# Patient Record
Sex: Male | Born: 1975 | Race: White | Marital: Married | Smoking: Former smoker
Health system: Southern US, Community
[De-identification: ages and names within clinical notes are randomized; demographics above are authoritative.]

---

## 2010-07-17 ENCOUNTER — Ambulatory Visit
Admission: RE | Admit: 2010-07-17 | Discharge: 2010-07-17 | Disposition: A | Discharge status: Home / Self Care | Payer: PRIVATE HEALTH INSURANCE | Source: Ambulatory Visit | Attending: Occupational Medicine | Admitting: Occupational Medicine

## 2010-07-17 ENCOUNTER — Other Ambulatory Visit: Payer: Self-pay | Admitting: Occupational Medicine

## 2010-07-17 DIAGNOSIS — Z021 Encounter for pre-employment examination: Secondary | ICD-10-CM

## 2011-04-21 ENCOUNTER — Other Ambulatory Visit: Payer: Self-pay | Admitting: Occupational Medicine

## 2011-04-21 ENCOUNTER — Ambulatory Visit
Admission: RE | Admit: 2011-04-21 | Discharge: 2011-04-21 | Disposition: A | Discharge status: Home / Self Care | Payer: PRIVATE HEALTH INSURANCE | Source: Ambulatory Visit | Attending: Occupational Medicine | Admitting: Occupational Medicine

## 2011-04-21 DIAGNOSIS — T1490XA Injury, unspecified, initial encounter: Secondary | ICD-10-CM

## 2012-04-24 IMAGING — CR DG WRIST COMPLETE 3+V*R*
4 series · 4 of 4 positions shown · non-contrast
Comparison: None

CLINICAL DATA: Fall 2 days ago.  Pain with motion.

RIGHT WRIST - COMPLETE 3+ VIEW

[view not recorded (1 of 4)]
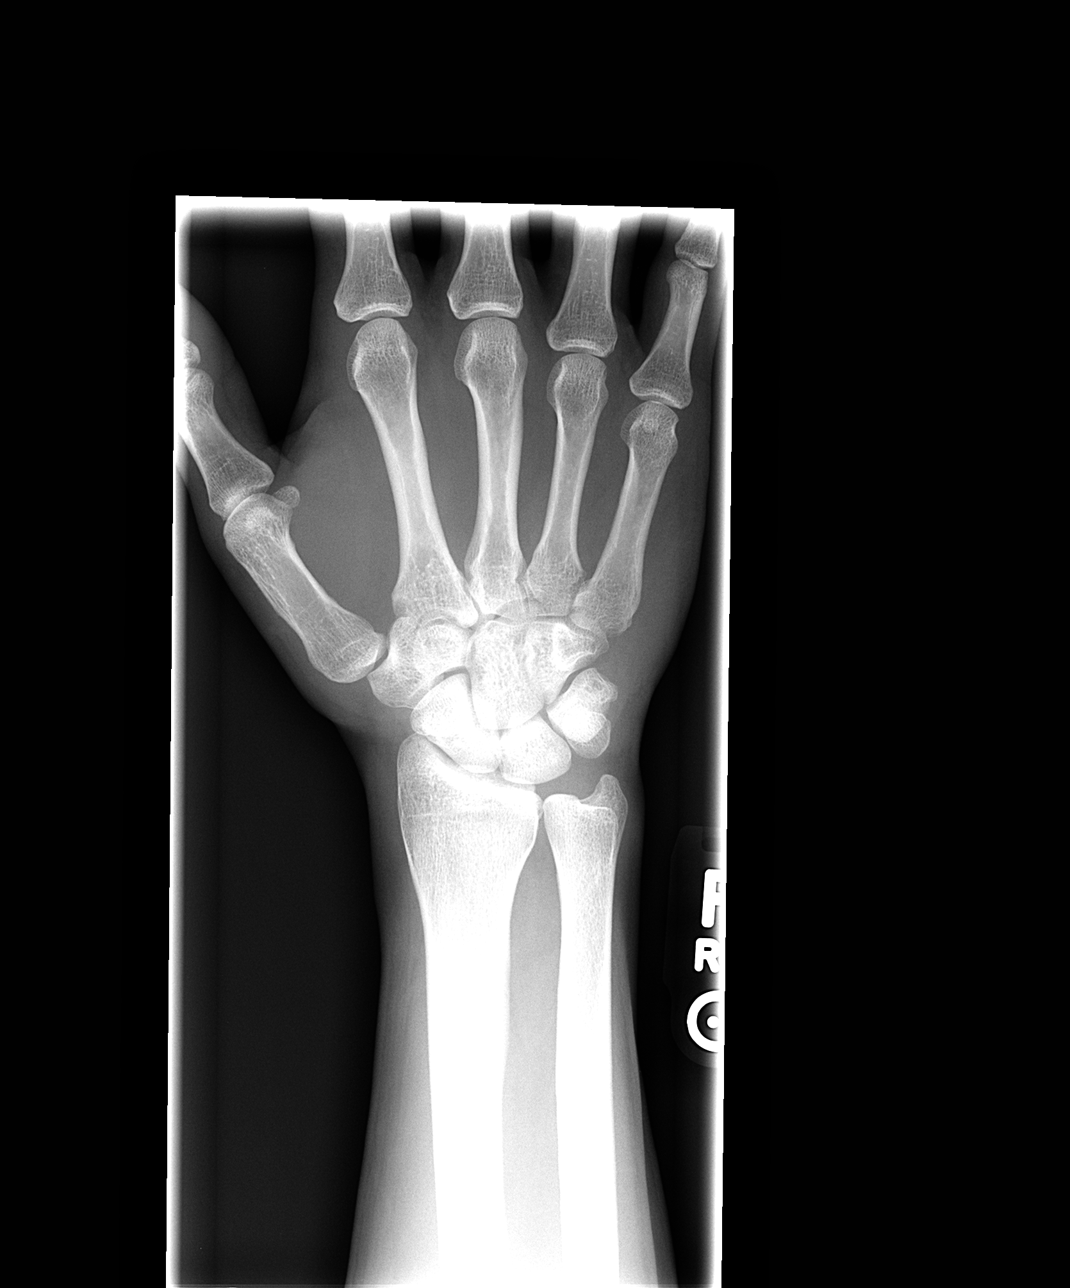

[view not recorded (2 of 4)]
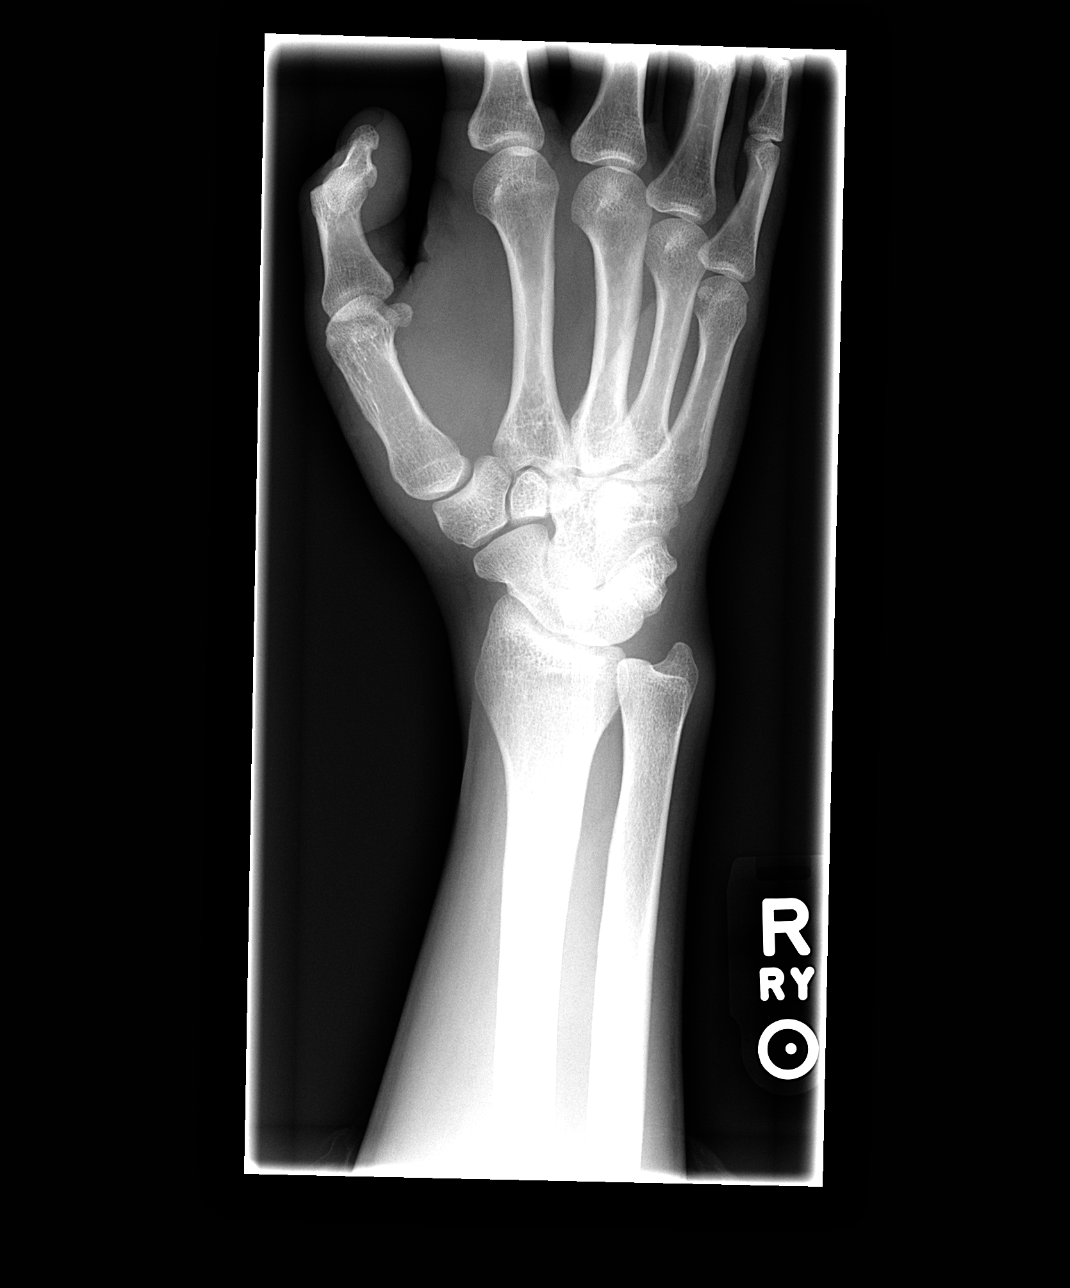

[view not recorded (3 of 4)]
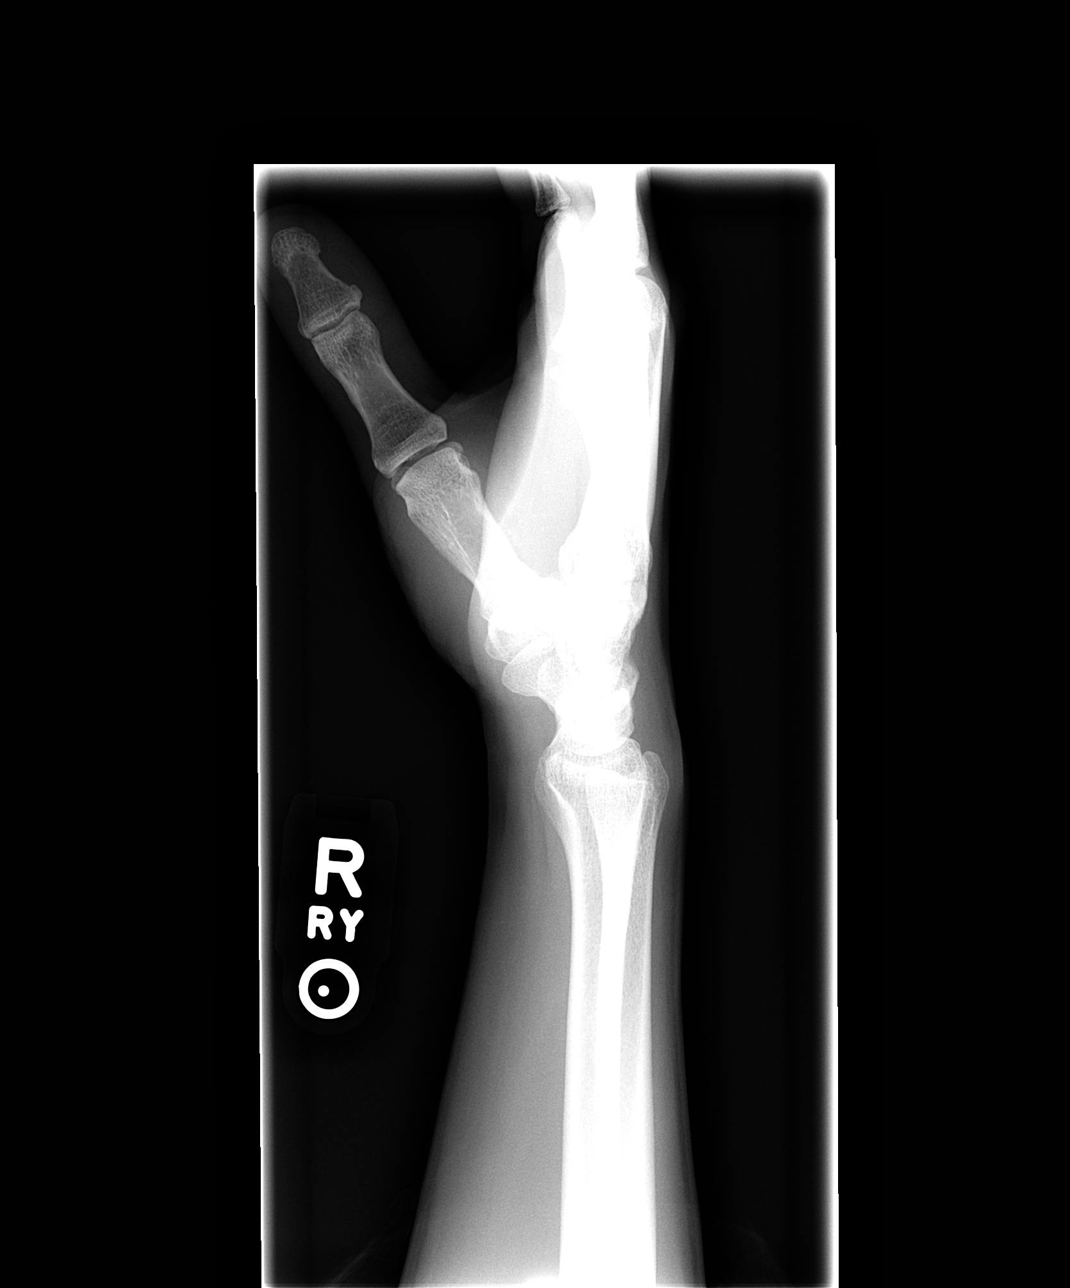

[view not recorded (4 of 4)]
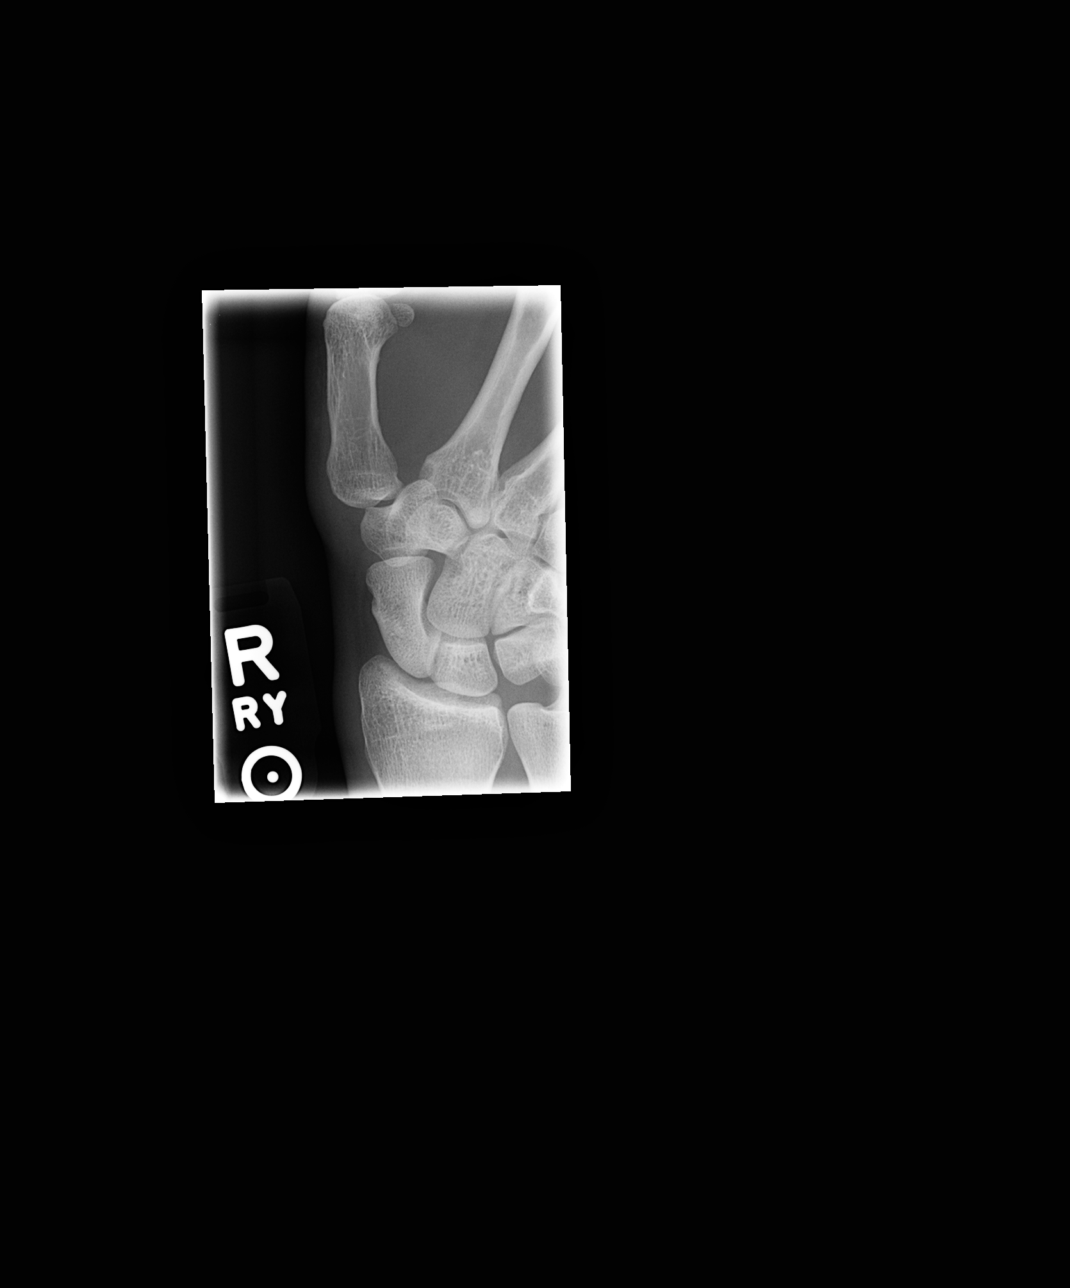

[4 of 4 positions shown; findings below may reference images not displayed]

FINDINGS: No acute fracture or dislocation.  Joint spaces are
maintained.  Scaphoid intact.
IMPRESSION: No acute osseous abnormality.

## 2016-06-23 DIAGNOSIS — S39012A Strain of muscle, fascia and tendon of lower back, initial encounter: Secondary | ICD-10-CM | POA: Diagnosis not present

## 2016-06-30 DIAGNOSIS — S39012D Strain of muscle, fascia and tendon of lower back, subsequent encounter: Secondary | ICD-10-CM | POA: Diagnosis not present

## 2016-08-10 DIAGNOSIS — Z23 Encounter for immunization: Secondary | ICD-10-CM | POA: Diagnosis not present

## 2016-08-10 DIAGNOSIS — Z0000 Encounter for general adult medical examination without abnormal findings: Secondary | ICD-10-CM | POA: Diagnosis not present

## 2016-08-10 DIAGNOSIS — M5416 Radiculopathy, lumbar region: Secondary | ICD-10-CM | POA: Diagnosis not present

## 2016-09-21 ENCOUNTER — Other Ambulatory Visit: Payer: Self-pay | Admitting: Family Medicine

## 2016-09-21 ENCOUNTER — Ambulatory Visit
Admission: RE | Admit: 2016-09-21 | Discharge: 2016-09-21 | Disposition: A | Discharge status: Home / Self Care | Payer: 59 | Source: Ambulatory Visit | Attending: Family Medicine | Admitting: Family Medicine

## 2016-09-21 DIAGNOSIS — M5416 Radiculopathy, lumbar region: Secondary | ICD-10-CM | POA: Diagnosis not present

## 2016-09-21 DIAGNOSIS — M47816 Spondylosis without myelopathy or radiculopathy, lumbar region: Secondary | ICD-10-CM | POA: Diagnosis not present

## 2016-09-21 DIAGNOSIS — E782 Mixed hyperlipidemia: Secondary | ICD-10-CM | POA: Diagnosis not present

## 2016-09-22 ENCOUNTER — Encounter: Payer: Self-pay | Admitting: Physical Therapy

## 2016-09-22 ENCOUNTER — Ambulatory Visit: Payer: 59 | Attending: Family Medicine | Admitting: Physical Therapy

## 2016-09-22 DIAGNOSIS — M545 Low back pain: Secondary | ICD-10-CM | POA: Diagnosis not present

## 2016-09-22 DIAGNOSIS — M62830 Muscle spasm of back: Secondary | ICD-10-CM | POA: Diagnosis not present

## 2016-09-22 DIAGNOSIS — M6281 Muscle weakness (generalized): Secondary | ICD-10-CM | POA: Diagnosis not present

## 2016-09-22 NOTE — Patient Instructions (Signed)
Back Hyperextension: Using Arms    Lying face down with arms bent, inhale. Then while exhaling, start by just going up to forearms, straighten arms as able. Hold _5___ seconds. Slowly return to starting position. Repeat __10__ times per set. Do __1-2__ sets per session. Do __3__ sessions per day.  Copyright  VHI. All rights reserved.

## 2016-09-23 NOTE — Therapy (Signed)
[facility] [address], [place], [place], [address] Phone: [phone]   Fax:  [phone]  Physical Therapy Evaluation  Patient Details  Name: Zachary Oliver MRN: [id number] Date of Birth: [birth year] Referring Provider: Lennette Bihari Via  Encounter Date: 09/22/2016      PT End of Session - 09/22/16 0753    Visit Number 1   Date for PT Re-Evaluation 12/15/16   PT Start Time 0756   PT Stop Time 0850   PT Time Calculation (min) 54 min   Activity Tolerance Patient tolerated treatment well   Behavior During Therapy [facility] for tasks assessed/performed      History reviewed. No pertinent past medical history.  History reviewed. No pertinent surgical history.  There were no vitals filed for this visit.       Subjective Assessment - 09/22/16 0757    Subjective Around Easter I was moving some things with poor mechanic, and my back started with stiffness and then progressed to lightning pain down back of legs and up into my neck.  I couldn't move well after that.  It has gotten better but I still feel like my muscle is going to cramp in the left hamstrings and calf, and the pain is focused more in left lower back.     Limitations Standing   How long can you stand comfortably? 20-30 minutes   Diagnostic tests x-ray   Patient Stated Goals Get back to where I can exercise on my own and be able to stand for 4 hours   Currently in Pain? Yes   Pain Score 2   after standing up to 7/10   Pain Location Back   Pain Orientation Left;Lower   Pain Descriptors / Indicators Aching;Throbbing   Pain Type Acute pain   Pain Onset More than a month ago   Pain Frequency Intermittent   Aggravating Factors  standing all day, can't lift left leg at the end of the day   Pain Relieving Factors heat, ice, and medication   Effect of Pain on Daily Activities working and exercise   Multiple Pain Sites No            [facility] PT Assessment - 09/23/16  0001      Assessment   Medical Diagnosis M54.16 (ICD-10-CM) - Radiculopathy, lumbar region   Onset Date/Surgical Date --  April 2018   Prior Therapy No     Precautions   Precautions None     Restrictions   Weight Bearing Restrictions No     Home Ecologist residence   Living Arrangements Spouse/significant other;Children     Prior Function   Level of Independence Independent   Vocation Full time employment   Vocation Requirements generally sitting, sometimes standing at night     Cognition   Overall Cognitive Status Within Functional Limits for tasks assessed     Observation/Other Assessments   Focus on Therapeutic Outcomes (FOTO)  54% limited     Posture/Postural Control   Posture/Postural Control Postural limitations   Postural Limitations Decreased lumbar lordosis     AROM   Lumbar Flexion 50% limited  pulling in back   Lumbar Extension 50% limited  increased pain     Strength   Right Hip Flexion 4+/5   Right Hip Extension 4+/5   Right Hip External Rotation  5/5   Right Hip Internal Rotation 4+/5   Right Hip ABduction 5/5   Right Hip ADduction 4+/5  Left Hip Flexion 4+/5   Left Hip Extension 4+/5   Left Hip External Rotation 4/5   Left Hip Internal Rotation 4/5   Left Hip ABduction 4/5   Left Hip ADduction 4/5     Flexibility   Soft Tissue Assessment /Muscle Length yes   Hamstrings 45 deg     Palpation   SI assessment  WNL   Palpation comment lumbar paraspinals tight     Special Tests    Special Tests Lumbar   Lumbar Tests Straight Leg Raise     Straight Leg Raise   Findings Positive   Side  Left   Comment improved with pelvic compression     Ambulation/Gait   Gait Pattern Within Functional Limits            Objective measurements completed on examination: See above findings.          [facility] [place] Adult PT Treatment/Exercise - 09/23/16 0001      Exercises   Exercises Lumbar     Lumbar Exercises:  Prone   Other Prone Lumbar Exercises press ups - 2 x 10     Modalities   Modalities Electrical Stimulation;Moist Heat     Moist Heat Therapy   Number Minutes Moist Heat 15 Minutes   Moist Heat Location Lumbar Spine     Electrical Stimulation   Electrical Stimulation Location lumbar    Electrical Stimulation Action IFC   Electrical Stimulation Parameters to tolerance x 15 min   Electrical Stimulation Goals Pain                PT Education - 09/22/16 2025    Education provided Yes   Education Details prone press ups   Person(s) Educated Patient   Methods Explanation;Handout   Comprehension Verbalized understanding          PT Short Term Goals - 09/23/16 0829      PT SHORT TERM GOAL #1   Title independent with initial HEP   Time 4   Period Weeks   Status New     PT SHORT TERM GOAL #2   Title reports 25% reduction of pain when getting out of bed in the morning   Time 4   Period Weeks   Status New     PT SHORT TERM GOAL #3   Title able to stand for 1 hour without increased pain   Time 4   Period Weeks   Status New     PT SHORT TERM GOAL #4   Title reports no radiating pain and able to centralize pain with extension exercises   Time 4   Period Weeks   Status New           PT Long Term Goals - 09/22/16 0865      PT LONG TERM GOAL #1   Title FOTO < or equal to 34% limited   Time 8   Period Weeks   Status New     PT LONG TERM GOAL #2   Title able to perform all job related tasks including standing for long periods of time with 50% less pain   Time 8   Period Weeks   Status New     PT LONG TERM GOAL #3   Title able to return to a regular exercise routine and safely work back into full exercise routine as part of advanced HEP   Time 8   Period Weeks   Status New     PT LONG TERM  GOAL #4   Title able to safely lift his child with good body mechanics and no increased pain   Time 8   Period Weeks   Status New                 Plan - 09/23/16 0813    Clinical Impression Statement Patient is active 41 y/o male who presents to the clinic with recent onset of low back pain with radiculopathy down left leg.  He started having this issue when he was lifting boxes that were far away from his body.  He is currently experiencing pain when standing for long hours which he has to do for work several times per week.  He is also unable to participate in his normal workout routine.  He has LE weakness left>right.  He has a positive straight leg raise test on the left but symptoms improve with pelvic compression demonstrating some core weakness.  His stork test also indicates some pelvic instability.  Pt has centralizing of symptoms with extension exerises.  He also has muscle spasm along lumbar paraspinals and decreased flexibility in the hamstrings.  Pt will benefit from skilled PT to address these impairments and return to PLOF.   Clinical Presentation Stable   Clinical Presentation due to: Not worsening   Clinical Decision Making Low   Rehab Potential Excellent   Clinical Impairments Affecting Rehab Potential n/a   PT Frequency 2x / week   PT Duration 8 weeks   PT Treatment/Interventions ADLs/Self Care Home Management;Cryotherapy;Electrical Stimulation;Iontophoresis 4mg /ml Dexamethasone;Moist Heat;Traction;Ultrasound;Therapeutic activities;Therapeutic exercise;Neuromuscular re-education;Patient/family education;Manual techniques;Dry needling;Taping   PT Next Visit Plan posture, extension exercises, hamstring stretch, core and hip strengthening, estim/heat/ice as needed   Recommended Other Services none   Consulted and Agree with Plan of Care Patient      Patient will benefit from skilled therapeutic intervention in order to improve the following deficits and impairments:  Decreased range of motion, Decreased strength, Increased muscle spasms, Pain, Impaired flexibility, Postural dysfunction  Visit Diagnosis: Acute left-sided  low back pain, with sciatica presence unspecified - Plan: PT plan of care cert/re-cert  Muscle weakness (generalized) - Plan: PT plan of care cert/re-cert  Muscle spasm of back - Plan: PT plan of care cert/re-cert     Problem List There are no active problems to display for this patient.   Zannie Cove, PT 09/23/2016, 8:46 AM  [facility] [address] [place], [place], [address] Phone: [phone]   Fax:  [phone]  Name: Zachary Oliver MRN: [id number] Date of Birth: 09/12/1975

## 2016-09-29 ENCOUNTER — Ambulatory Visit: Payer: 59 | Admitting: Physical Therapy

## 2016-09-29 DIAGNOSIS — M545 Low back pain: Secondary | ICD-10-CM

## 2016-09-29 DIAGNOSIS — M62830 Muscle spasm of back: Secondary | ICD-10-CM

## 2016-09-29 DIAGNOSIS — M6281 Muscle weakness (generalized): Secondary | ICD-10-CM

## 2016-09-29 NOTE — Therapy (Signed)
[facility] [address], [place], [place], [address] Phone: [phone]   Fax:  [phone]  Physical Therapy Treatment  Patient Details  Name: Zachary Oliver MRN: [id number] Date of Birth: [birth year] Referring Provider: Lennette Oliver Via  Encounter Date: 09/29/2016      PT End of Session - 09/29/16 1700    Visit Number 2   Date for PT Re-Evaluation 12/15/16   PT Start Time 0736   PT Stop Time 0815   PT Time Calculation (min) 39 min   Activity Tolerance Patient tolerated treatment well      No past medical history on file.  No past surgical history on file.  There were no vitals filed for this visit.      Subjective Assessment - 09/29/16 0737    Subjective Pretty rough.  Avoiding twisting and pushing/pulling.  After 6 pm and early mornings are bad.  Still with left LE pain.  Right LE symptoms have improved.  The day of therapy was great.   I am surprised that I can do the press ups but no big changes during or after.  I have been extra painful in the last week.    Currently in Pain? Yes   Pain Score 2    Pain Location Back   Pain Orientation Left   Pain Type Acute pain   Aggravating Factors  twisting, pushing/pulling   Pain Relieving Factors heat, ice, medication                         [facility] Adult PT Treatment/Exercise - 09/29/16 0001      Self-Care   Lifting squat method   Posture use of lumbar roll   Other Self-Care Comments  centralization principle     Lumbar Exercises: Prone   Other Prone Lumbar Exercises press ups with progressively straighter  3x5     Moist Heat Therapy   Number Minutes Moist Heat 15 Minutes   Moist Heat Location Lumbar Spine     Electrical Stimulation   Electrical Stimulation Location lumbar    Electrical Stimulation Action IFC   Electrical Stimulation Parameters 11 ma 15 min supine   Electrical Stimulation Goals Pain                PT Education -  09/29/16 1700    Education provided Yes   Education Details postural education   Person(s) Educated Patient   Methods Explanation;Demonstration   Comprehension Verbalized understanding          PT Short Term Goals - 09/29/16 1704      PT SHORT TERM GOAL #1   Title independent with initial HEP   Time 4   Period Weeks   Status On-going     PT SHORT TERM GOAL #2   Title reports 25% reduction of pain when getting out of bed in the morning   Time 4   Period Weeks   Status On-going     PT SHORT TERM GOAL #3   Title able to stand for 1 hour without increased pain   Time 4   Period Weeks   Status On-going     PT SHORT TERM GOAL #4   Title reports no radiating pain and able to centralize pain with extension exercises   Time 4   Period Weeks   Status On-going           PT Long Term Goals - 09/29/16 1704  PT LONG TERM GOAL #1   Title FOTO < or equal to 34% limited   Time 8   Period Weeks   Status On-going     PT LONG TERM GOAL #2   Title able to perform all job related tasks including standing for long periods of time with 50% less pain   Time 8   Period Weeks   Status On-going     PT LONG TERM GOAL #3   Title able to return to a regular exercise routine and safely work back into full exercise routine as part of advanced HEP   Time 8   Period Weeks   Status On-going     PT LONG TERM GOAL #4   Title able to safely lift his child with good body mechanics and no increased pain   Time 8   Period Weeks   Status On-going               Plan - 09/29/16 1700    Clinical Impression Statement The patient has been doing partial press ups only in limited number.  Discussed progression to full ROM for best outcomes as well as increased frequency of performance.  Extensive discussion on centralization vs peripheralization using stoplight method (McKenzie principle).  Patient receptive to postural education.  Symptoms remain proximal today.     PT Frequency 2x  / week   PT Duration 8 weeks   PT Treatment/Interventions ADLs/Self Care Home Management;Cryotherapy;Electrical Stimulation;Iontophoresis 4mg /ml Dexamethasone;Moist Heat;Traction;Ultrasound;Therapeutic activities;Therapeutic exercise;Neuromuscular re-education;Patient/family education;Manual techniques;Dry needling;Taping   PT Next Visit Plan posture, extension exercises,  may try lateral techniques and/or overpressure per McKenzie method;  if unable to fully centralize will consider traction;   hamstring stretch, core and hip strengthening, estim/heat/ice as needed      Patient will benefit from skilled therapeutic intervention in order to improve the following deficits and impairments:  Decreased range of motion, Decreased strength, Increased muscle spasms, Pain, Impaired flexibility, Postural dysfunction  Visit Diagnosis: Acute left-sided low back pain, with sciatica presence unspecified  Muscle weakness (generalized)  Muscle spasm of back     Problem List There are no active problems to display for this patient.  Ruben Im, PT 09/29/16 5:05 PM Phone: [phone] Fax: [phone]  [name] 09/29/2016, 5:05 PM  [facility] [address] [place], [place], [address] Phone: [phone]   Fax:  [phone]  Name: Zachary Oliver MRN: [id number] Date of Birth: July 15, 1975

## 2016-10-01 ENCOUNTER — Ambulatory Visit: Payer: 59 | Admitting: Physical Therapy

## 2016-10-01 DIAGNOSIS — M545 Low back pain: Secondary | ICD-10-CM | POA: Diagnosis not present

## 2016-10-01 DIAGNOSIS — M62830 Muscle spasm of back: Secondary | ICD-10-CM

## 2016-10-01 DIAGNOSIS — M6281 Muscle weakness (generalized): Secondary | ICD-10-CM

## 2016-10-01 NOTE — Therapy (Signed)
[facility] [address] [place], [place], [address] Phone: [phone]   Fax:  [phone]  Physical Therapy Treatment  Patient Details  Name: Zachary Oliver MRN: [id number] Date of Birth: Apr 24, 1975 Referring Provider: Lennette Bihari Via  Encounter Date: 10/01/2016      PT End of Session - 10/01/16 0906    Visit Number 3   Date for PT Re-Evaluation 12/15/16   PT Start Time 0730   PT Stop Time 0815   PT Time Calculation (min) 45 min   Activity Tolerance Patient tolerated treatment well      No past medical history on file.  No past surgical history on file.  There were no vitals filed for this visit.      Subjective Assessment - 10/01/16 0727    Subjective I'm limited in my ROM.  I feel stiff.   It feels like I have a hitch in left low back even with walking.  Did press ups 3x in the past 2 days.  Pain still to the left.  No early AM pain last 2 mornings into the groin area as previous.  Shooting pains in the evenings after a day at the office.     Currently in Pain? Yes   Pain Score 2    Pain Location Back   Pain Orientation Left   Pain Type Acute pain   Pain Onset More than a month ago   Pain Frequency Intermittent                         [facility] Adult PT Treatment/Exercise - 10/01/16 0001      Lumbar Exercises: Standing   Other Standing Lumbar Exercises left lateral glide against wall   Other Standing Lumbar Exercises standing extensions 10x     Lumbar Exercises: Prone   Other Prone Lumbar Exercises press ups 6x; with exhale 6x;  press ups in roadkill position 8x   Other Prone Lumbar Exercises press ups with manual overpressure 5x     Moist Heat Therapy   Number Minutes Moist Heat 15 Minutes   Moist Heat Location Lumbar Spine     Electrical Stimulation   Electrical Stimulation Location lumbar    Electrical Stimulation Action IFC   Electrical Stimulation Parameters 11 ma 15 min   Electrical Stimulation Goals Pain                  PT Short Term Goals - 10/01/16 2355      PT SHORT TERM GOAL #1   Title independent with initial HEP   Time 4   Period Weeks   Status On-going     PT SHORT TERM GOAL #2   Title reports 25% reduction of pain when getting out of bed in the morning   Time 4   Period Weeks   Status On-going     PT SHORT TERM GOAL #3   Title able to stand for 1 hour without increased pain   Time 4   Period Weeks   Status On-going     PT SHORT TERM GOAL #4   Title reports no radiating pain and able to centralize pain with extension exercises   Time 4   Period Weeks   Status On-going           PT Long Term Goals - 10/01/16 0919      PT LONG TERM GOAL #1   Title FOTO < or equal to 34%  limited   Time 8   Period Weeks   Status On-going     PT LONG TERM GOAL #2   Title able to perform all job related tasks including standing for long periods of time with 50% less pain   Time 8   Period Weeks   Status On-going     PT LONG TERM GOAL #3   Title able to return to a regular exercise routine and safely work back into full exercise routine as part of advanced HEP   Time 8   Period Weeks   Status On-going     PT LONG TERM GOAL #4   Title able to safely lift his child with good body mechanics and no increased pain   Time 8   Period Weeks   Status On-going               Plan - 10/01/16 0906    Clinical Impression Statement The patient reports he has slept better the last 2 nights but continues to have left LBP and intermittent left LE pain.  Encouraged increased performance of extension biased (McKenzie exercises) every 2 hours rather than 2x/day.  Symptoms fairly centralized in sagittal plane.  Good pain relief with electrical stimulation/heat.     Rehab Potential Excellent   PT Frequency 2x / week   PT Duration 8 weeks   PT Treatment/Interventions ADLs/Self Care Home Management;Cryotherapy;Electrical  Stimulation;Iontophoresis 4mg /ml Dexamethasone;Moist Heat;Traction;Ultrasound;Therapeutic activities;Therapeutic exercise;Neuromuscular re-education;Patient/family education;Manual techniques;Dry needling;Taping   PT Next Visit Plan posture, extension exercises with overpressure,  may try lateral techniques and flexion/rotation mobilization;  if unable to fully centralize will consider traction;   hamstring stretch, core and hip strengthening, estim/heat/ice as needed      Patient will benefit from skilled therapeutic intervention in order to improve the following deficits and impairments:  Decreased range of motion, Decreased strength, Increased muscle spasms, Pain, Impaired flexibility, Postural dysfunction  Visit Diagnosis: Acute left-sided low back pain, with sciatica presence unspecified  Muscle weakness (generalized)  Muscle spasm of back     Problem List There are no active problems to display for this patient.  Ruben Im, PT 10/01/16 9:33 AM Phone: [phone] Fax: [phone]  [name] 10/01/2016, 9:32 AM  [facility] [address] [place], [place], [address] Phone: [phone]   Fax:  [phone]  Name: Zachary Oliver MRN: [id number] Date of Birth: 09-May-1975

## 2016-10-06 ENCOUNTER — Ambulatory Visit: Payer: 59 | Admitting: Physical Therapy

## 2016-10-08 ENCOUNTER — Ambulatory Visit: Payer: 59 | Admitting: Physical Therapy

## 2016-10-08 DIAGNOSIS — M6281 Muscle weakness (generalized): Secondary | ICD-10-CM

## 2016-10-08 DIAGNOSIS — M545 Low back pain: Secondary | ICD-10-CM | POA: Diagnosis not present

## 2016-10-08 DIAGNOSIS — M62830 Muscle spasm of back: Secondary | ICD-10-CM

## 2016-10-08 NOTE — Therapy (Signed)
[facility] [address] [place], [place], [address] Phone: [phone]   Fax:  [phone]  Physical Therapy Treatment  Patient Details  Name: Zachary Oliver MRN: [id number] Date of Birth: Jul 13, 1975 Referring Provider: Lennette Bihari Via  Encounter Date: 10/08/2016      PT End of Session - 10/08/16 1053    Visit Number 4   Date for PT Re-Evaluation 12/15/16   PT Start Time 0730   PT Stop Time 0815   PT Time Calculation (min) 45 min   Activity Tolerance Patient tolerated treatment well      No past medical history on file.  No past surgical history on file.  There were no vitals filed for this visit.      Subjective Assessment - 10/08/16 0733    Subjective It's going alright.  Having some evening tightness and shooting pain in left posterior thigh while sitting or rising after sitting a short amount of time.  Mornings are generally good.  Exercises "not bad."   Left spine "heat area."  Been in class the last 3 days but I was up more often than on a typical as often.     Currently in Pain? No/denies   Pain Score 0-No pain                         [facility] Adult PT Treatment/Exercise - 10/08/16 0001      Lumbar Exercises: Supine   Ab Set 10 reps   Isometric Hip Flexion 10 reps     Lumbar Exercises: Prone   Other Prone Lumbar Exercises press ups with progressively straighter  3x5   Other Prone Lumbar Exercises press ups with belt fixation 10x     Lumbar Exercises: Quadruped   Single Arm Raise Right;Left;5 reps   Straight Leg Raise 5 reps   Opposite Arm/Leg Raise Right arm/Left leg;Left arm/Right leg;5 reps     Moist Heat Therapy   Number Minutes Moist Heat 15 Minutes   Moist Heat Location Lumbar Spine     Electrical Stimulation   Electrical Stimulation Location lumbar    Electrical Stimulation Action IFC   Electrical Stimulation Parameters 11 ma 15 min   Electrical Stimulation Goals Pain                   PT Short Term Goals - 10/08/16 1058      PT SHORT TERM GOAL #1   Title independent with initial HEP   Time 4   Period Weeks   Status On-going     PT SHORT TERM GOAL #2   Title reports 25% reduction of pain when getting out of bed in the morning   Status On-going     PT SHORT TERM GOAL #3   Title able to stand for 1 hour without increased pain   Time 4   Period Weeks   Status On-going     PT SHORT TERM GOAL #4   Title reports no radiating pain and able to centralize pain with extension exercises   Time 4   Period Weeks   Status On-going           PT Long Term Goals - 10/08/16 1059      PT LONG TERM GOAL #1   Title FOTO < or equal to 34% limited   Time 8   Period Weeks   Status On-going     PT LONG TERM GOAL #2  Title able to perform all job related tasks including standing for long periods of time with 50% less pain   Time 8   Period Weeks   Status On-going     PT LONG TERM GOAL #3   Title able to return to a regular exercise routine and safely work back into full exercise routine as part of advanced HEP   Time 8   Period Weeks   Status On-going     PT LONG TERM GOAL #4   Title able to safely lift his child with good body mechanics and no increased pain   Time 8   Period Weeks   Status On-going               Plan - 10/08/16 1053    Clinical Impression Statement The patient has left pelvic drop with single leg standing indicating gluteal muscle weakness.  Able to progress to lumbo/pelvic/hip strengthening without exacerbation of symptoms.  If symptoms not fully centralized next visit, will proceed with lateral compartment techniques and possible traction.     PT Frequency 2x / week   PT Duration 8 weeks   PT Treatment/Interventions ADLs/Self Care Home Management;Cryotherapy;Electrical Stimulation;Iontophoresis 4mg /ml Dexamethasone;Moist Heat;Traction;Ultrasound;Therapeutic activities;Therapeutic exercise;Neuromuscular  re-education;Patient/family education;Manual techniques;Dry needling;Taping   PT Next Visit Plan posture, extension exercises with overpressure,  may try lateral techniques and flexion/rotation mobilization;  if unable to fully centralize will consider traction;   hamstring stretch, core and hip strengthening, estim/heat/ice as needed      Patient will benefit from skilled therapeutic intervention in order to improve the following deficits and impairments:  Decreased range of motion, Decreased strength, Increased muscle spasms, Pain, Impaired flexibility, Postural dysfunction  Visit Diagnosis: Acute left-sided low back pain, with sciatica presence unspecified  Muscle weakness (generalized)  Muscle spasm of back     Problem List There are no active problems to display for this patient.  Ruben Im, PT 10/08/16 11:01 AM Phone: [phone] Fax: [phone]  [name] 10/08/2016, 11:00 AM  [facility] [address], [place], [place], [address] Phone: [phone]   Fax:  [phone]  Name: Zachary Oliver MRN: [id number] Date of Birth: 1975/04/04

## 2016-10-13 ENCOUNTER — Ambulatory Visit: Payer: 59 | Admitting: Physical Therapy

## 2016-10-13 DIAGNOSIS — M545 Low back pain: Secondary | ICD-10-CM | POA: Diagnosis not present

## 2016-10-13 DIAGNOSIS — M62830 Muscle spasm of back: Secondary | ICD-10-CM

## 2016-10-13 DIAGNOSIS — M6281 Muscle weakness (generalized): Secondary | ICD-10-CM

## 2016-10-13 NOTE — Therapy (Signed)
[facility] [address] [place], [place], [address] Phone: [phone]   Fax:  [phone]  Physical Therapy Treatment  Patient Details  Name: Zachary Oliver MRN: [id number] Date of Birth: 1975/10/28 Referring Provider: Lennette Bihari Via  Encounter Date: 10/13/2016      PT End of Session - 10/13/16 0758    Visit Number 5   Date for PT Re-Evaluation 12/15/16   PT Start Time 0730   PT Stop Time 0810   PT Time Calculation (min) 40 min   Activity Tolerance Patient tolerated treatment well      No past medical history on file.  No past surgical history on file.  There were no vitals filed for this visit.      Subjective Assessment - 10/13/16 0729    Subjective Went to [place].   OK with movement.  I do not like to just stand.  Still with LE symptoms daily.  I feel like a stiff old man.     Currently in Pain? Yes   Pain Score 2    Pain Location Back   Pain Orientation Left   Pain Type Acute pain   Aggravating Factors  standing, late evenings   Pain Relieving Factors moving around                Review of previous HEP and response.         [facility] Adult PT Treatment/Exercise - 10/13/16 0001      Lumbar Exercises: Seated   Sit to Stand Limitations neural flossing 8x right/left     Lumbar Exercises: Supine   Other Supine Lumbar Exercises sciatic neural flossing 5x right/left     Lumbar Exercises: Prone   Other Prone Lumbar Exercises press ups 10x   Other Prone Lumbar Exercises press ups with bent knee 5x     Traction   Type of Traction Lumbar   Min (lbs) 45   Max (lbs) 90   Hold Time 60   Rest Time 20   Time 15 min                PT Education - 10/13/16 0758    Education provided Yes   Education Details neural flossing   Person(s) Educated Patient   Methods Explanation;Demonstration;Handout   Comprehension Verbalized understanding;Returned demonstration          PT  Short Term Goals - 10/13/16 1200      PT SHORT TERM GOAL #1   Title independent with initial HEP   Status Achieved     PT SHORT TERM GOAL #2   Title reports 25% reduction of pain when getting out of bed in the morning   Time 4   Period Weeks   Status On-going     PT SHORT TERM GOAL #3   Title able to stand for 1 hour without increased pain   Time 4   Period Weeks   Status On-going     PT SHORT TERM GOAL #4   Title reports no radiating pain and able to centralize pain with extension exercises   Time 4   Period Weeks   Status On-going           PT Long Term Goals - 10/13/16 1201      PT LONG TERM GOAL #1   Title FOTO < or equal to 34% limited   Time 8   Period Weeks   Status On-going     PT LONG TERM GOAL #  2   Title able to perform all job related tasks including standing for long periods of time with 50% less pain   Time 8   Period Weeks   Status On-going     PT LONG TERM GOAL #3   Title able to return to a regular exercise routine and safely work back into full exercise routine as part of advanced HEP   Time 8   Period Weeks   Status On-going     PT LONG TERM GOAL #4   Title able to safely lift his child with good body mechanics and no increased pain   Time 8   Period Weeks   Status On-going               Plan - 10/13/16 0758    Clinical Impression Statement The patient reports continued daily left thigh shooting pain especially with standing.  Reports compliance with HEP.  + cross SLR.  Able to add neural flossing without pain exacerbation.  Good initial response to mechanical lumbar traction.     Rehab Potential Excellent   PT Frequency 2x / week   PT Duration 8 weeks   PT Treatment/Interventions ADLs/Self Care Home Management;Cryotherapy;Electrical Stimulation;Iontophoresis 4mg /ml Dexamethasone;Moist Heat;Traction;Ultrasound;Therapeutic activities;Therapeutic exercise;Neuromuscular re-education;Patient/family education;Manual techniques;Dry  needling;Taping   PT Next Visit Plan assess response to lumbar traction and continue if helpful; assess response to neural flossing;   flexion rotation;  core strengthening progression      Patient will benefit from skilled therapeutic intervention in order to improve the following deficits and impairments:  Decreased range of motion, Decreased strength, Increased muscle spasms, Pain, Impaired flexibility, Postural dysfunction  Visit Diagnosis: Acute left-sided low back pain, with sciatica presence unspecified  Muscle weakness (generalized)  Muscle spasm of back     Problem List There are no active problems to display for this patient.  Ruben Im, PT 10/13/16 12:03 PM Phone: [phone] Fax: [phone]  [name] 10/13/2016, 12:02 PM  [facility] [address], [place], [place], [address] Phone: [phone]   Fax:  [phone]  Name: Ramiz Turpin MRN: [id number] Date of Birth: [birth year]

## 2016-10-13 NOTE — Patient Instructions (Signed)
Zachary Oliver PT [facility] [address], [address] [place], [place] [address] Phone # [phone] Fax [phone]    

## 2016-10-15 ENCOUNTER — Ambulatory Visit: Payer: 59 | Admitting: Physical Therapy

## 2016-10-15 DIAGNOSIS — M62830 Muscle spasm of back: Secondary | ICD-10-CM

## 2016-10-15 DIAGNOSIS — M545 Low back pain: Secondary | ICD-10-CM | POA: Diagnosis not present

## 2016-10-15 DIAGNOSIS — M6281 Muscle weakness (generalized): Secondary | ICD-10-CM

## 2016-10-15 NOTE — Therapy (Signed)
[facility] [address] [place], [place], [address] Phone: [phone]   Fax:  [phone]  Physical Therapy Treatment  Patient Details  Name: Zachary Oliver MRN: [id number] Date of Birth: Mar 04, 1976 Referring Provider: Lennette Bihari Via  Encounter Date: 10/15/2016      PT End of Session - 10/15/16 1204    Visit Number 6   Date for PT Re-Evaluation 12/15/16   PT Start Time 0730   PT Stop Time 0815   PT Time Calculation (min) 45 min   Activity Tolerance Patient tolerated treatment well      No past medical history on file.  No past surgical history on file.  There were no vitals filed for this visit.      Subjective Assessment - 10/15/16 0729    Subjective I think the traction helped but I can't quantify it.  Stiff more than pain.     Currently in Pain? Yes   Pain Score 2    Pain Location Back   Pain Orientation Left   Pain Type Acute pain   Pain Onset More than a month ago   Pain Frequency Intermittent                         [facility] Adult PT Treatment/Exercise - 10/15/16 0001      Lumbar Exercises: Sidelying   Other Sidelying Lumbar Exercises flexion rotation over folded pillow   5 minutes     Lumbar Exercises: Prone   Other Prone Lumbar Exercises press ups 10x2     Lumbar Exercises: Quadruped   Single Arm Raise Right;Left;5 reps   Straight Leg Raise 5 reps   Opposite Arm/Leg Raise Right arm/Left leg;Left arm/Right leg;5 reps     Traction   Type of Traction --  disc involve with muscle guarding protocol:  3 steps   Min (lbs) 45   Max (lbs) 90   Hold Time 60   Rest Time 10   Time 15 min     Manual Therapy   Manual Therapy Joint mobilization   Manual therapy comments press ups with manual overpressure 10x   Joint Mobilization PA extension mobs grade 3 5x                  PT Short Term Goals - 10/15/16 1210      PT SHORT TERM GOAL #1   Title independent with  initial HEP   Status Achieved     PT SHORT TERM GOAL #2   Title reports 25% reduction of pain when getting out of bed in the morning   Time 4   Period Weeks   Status On-going     PT SHORT TERM GOAL #3   Title able to stand for 1 hour without increased pain   Time 4   Period Weeks   Status On-going     PT SHORT TERM GOAL #4   Title reports no radiating pain and able to centralize pain with extension exercises   Time 4   Period Weeks   Status On-going           PT Long Term Goals - 10/15/16 1211      PT LONG TERM GOAL #1   Title FOTO < or equal to 34% limited   Time 8   Period Weeks   Status On-going     PT LONG TERM GOAL #2   Title able to perform all job related  tasks including standing for long periods of time with 50% less pain   Time 8   Period Weeks   Status On-going     PT LONG TERM GOAL #3   Title able to return to a regular exercise routine and safely work back into full exercise routine as part of advanced HEP   Time 8   Period Weeks   Status On-going     PT LONG TERM GOAL #4   Title able to safely lift his child with good body mechanics and no increased pain   Time 8   Period Weeks   Status On-going               Plan - 10/15/16 1204    Clinical Impression Statement The patient continues to have sharp and shooting pain into left LE periodically in addition to LBP.  Symptoms centralized with extension but do not stay better.  Lumbar extension mobility much improved.  No improvement with flexion rotation.  Continue with lumbar traction trial in hopes of longer term relief of peripheral symptoms.     Rehab Potential Excellent   Clinical Impairments Affecting Rehab Potential n/a   PT Frequency 2x / week   PT Duration 8 weeks   PT Treatment/Interventions ADLs/Self Care Home Management;Cryotherapy;Electrical Stimulation;Iontophoresis 4mg /ml Dexamethasone;Moist Heat;Traction;Ultrasound;Therapeutic activities;Therapeutic exercise;Neuromuscular  re-education;Patient/family education;Manual techniques;Dry needling;Taping   PT Next Visit Plan  assess progress toward goals; continue lumbar traction if helpful; extension biased ex with overpressure;  core strengthening progression;  electrical stimulation/heat if needed      Patient will benefit from skilled therapeutic intervention in order to improve the following deficits and impairments:  Decreased range of motion, Decreased strength, Increased muscle spasms, Pain, Impaired flexibility, Postural dysfunction  Visit Diagnosis: Acute left-sided low back pain, with sciatica presence unspecified  Muscle weakness (generalized)  Muscle spasm of back     Problem List There are no active problems to display for this patient.  Ruben Im, PT 10/15/16 12:14 PM Phone: [phone] Fax: [phone]  [name] 10/15/2016, 12:13 PM  [facility] [address], [place], [place], [address] Phone: [phone]   Fax:  [phone]  Name: Zachary Oliver MRN: [id number] Date of Birth: 03-Apr-1975

## 2016-10-20 ENCOUNTER — Ambulatory Visit: Payer: 59

## 2016-10-20 DIAGNOSIS — M6281 Muscle weakness (generalized): Secondary | ICD-10-CM

## 2016-10-20 DIAGNOSIS — M62830 Muscle spasm of back: Secondary | ICD-10-CM

## 2016-10-20 DIAGNOSIS — M545 Low back pain: Secondary | ICD-10-CM

## 2016-10-20 NOTE — Therapy (Signed)
[facility] [address], [place], [place], [address] Phone: [phone]   Fax:  [phone]  Physical Therapy Treatment  Patient Details  Name: Zachary Oliver MRN: [id number] Date of Birth: 07/20/1975 Referring Provider: Lennette Bihari Via  Encounter Date: 10/20/2016      PT End of Session - 10/20/16 0801    Visit Number 7   Date for PT Re-Evaluation 12/15/16   PT Start Time 0732   PT Stop Time 0815   PT Time Calculation (min) 43 min   Activity Tolerance Patient tolerated treatment well   Behavior During Therapy [facility] for tasks assessed/performed      History reviewed. No pertinent past medical history.  History reviewed. No pertinent surgical history.  There were no vitals filed for this visit.      Subjective Assessment - 10/20/16 0738    Subjective I am really having radiating pain in Lt LE.  Not sure what I did.     Currently in Pain? Yes   Pain Score 2   1-5/10   Pain Location Back   Pain Orientation Left   Pain Descriptors / Indicators Aching;Throbbing   Pain Type Acute pain   Pain Onset More than a month ago   Pain Frequency Intermittent   Aggravating Factors  standing, in the evening   Pain Relieving Factors moving around                         [facility] Adult PT Treatment/Exercise - 10/20/16 0001      Lumbar Exercises: Supine   Bridge 20 reps;5 seconds     Lumbar Exercises: Prone   Other Prone Lumbar Exercises press ups 10x2     Lumbar Exercises: Quadruped   Single Arm Raise Right;Left;5 reps   Straight Leg Raise 10 reps   Opposite Arm/Leg Raise Right arm/Left leg;Left arm/Right leg;5 reps     Traction   Type of Traction Lumbar   Min (lbs) 45   Max (lbs) 100   Hold Time 50   Rest Time 10   Time 15 min                  PT Short Term Goals - 10/20/16 0735      PT SHORT TERM GOAL #2   Title reports 25% reduction of pain when getting out of bed in the morning   Time 4   Period Weeks   Status On-going     PT SHORT TERM GOAL #3   Title able to stand for 1 hour without increased pain   Time 4   Period Weeks   Status On-going           PT Long Term Goals - 10/15/16 1211      PT LONG TERM GOAL #1   Title FOTO < or equal to 34% limited   Time 8   Period Weeks   Status On-going     PT LONG TERM GOAL #2   Title able to perform all job related tasks including standing for long periods of time with 50% less pain   Time 8   Period Weeks   Status On-going     PT LONG TERM GOAL #3   Title able to return to a regular exercise routine and safely work back into full exercise routine as part of advanced HEP   Time 8   Period Weeks   Status On-going     PT  LONG TERM GOAL #4   Title able to safely lift his child with good body mechanics and no increased pain   Time 8   Period Weeks   Status On-going               Plan - 10/20/16 0740    Clinical Impression Statement Pt with flare-up of Lt LE radiculopathy over the past few days and is not sure of the cause.  Symptoms continue to Lt LE to the knee and sometimes into the calf.  PT is continuing with extension based exercise and traction.  Pt has been encouraged to contact MD regarding lack of change of symptoms.  Pt is limited to standing long periods.  Pt will continue to benefit from skilled PT for traction and extension based exercise.     Rehab Potential Excellent   PT Frequency 2x / week   PT Duration 8 weeks   PT Treatment/Interventions ADLs/Self Care Home Management;Cryotherapy;Electrical Stimulation;Iontophoresis 4mg /ml Dexamethasone;Moist Heat;Traction;Ultrasound;Therapeutic activities;Therapeutic exercise;Neuromuscular re-education;Patient/family education;Manual techniques;Dry needling;Taping   PT Next Visit Plan  assess progress toward goals; continue lumbar traction if helpful; extension biased ex with overpressure;  core strengthening progression;  electrical  stimulation/heat if needed   Consulted and Agree with Plan of Care Patient      Patient will benefit from skilled therapeutic intervention in order to improve the following deficits and impairments:  Decreased range of motion, Decreased strength, Increased muscle spasms, Pain, Impaired flexibility, Postural dysfunction  Visit Diagnosis: Acute left-sided low back pain, with sciatica presence unspecified  Muscle weakness (generalized)  Muscle spasm of back     Problem List There are no active problems to display for this patient.    Sigurd Sos, PT 10/20/16 8:04 AM  [facility] [address] [place], [place], [address] Phone: [phone]   Fax:  [phone]  Name: Zachary Oliver MRN: [id number] Date of Birth: 15-Jun-1975

## 2016-10-22 ENCOUNTER — Ambulatory Visit: Payer: 59 | Attending: Family Medicine | Admitting: Physical Therapy

## 2016-10-22 DIAGNOSIS — M545 Low back pain: Secondary | ICD-10-CM | POA: Diagnosis present

## 2016-10-22 DIAGNOSIS — M6281 Muscle weakness (generalized): Secondary | ICD-10-CM | POA: Diagnosis not present

## 2016-10-22 DIAGNOSIS — M62830 Muscle spasm of back: Secondary | ICD-10-CM | POA: Insufficient documentation

## 2016-10-22 NOTE — Therapy (Signed)
[facility] [address], [place], [place], [address] Phone: [phone]   Fax:  [phone]  Physical Therapy Treatment  Patient Details  Name: Zachary Oliver MRN: [id number] Date of Birth: [birth year] Referring Provider: Lennette Bihari Via  Encounter Date: 10/22/2016      PT End of Session - 10/22/16 0743    Visit Number 8   Date for PT Re-Evaluation 12/15/16   PT Start Time 0730   PT Stop Time 0810   PT Time Calculation (min) 40 min   Activity Tolerance Patient tolerated treatment well      No past medical history on file.  No past surgical history on file.  There were no vitals filed for this visit.      Subjective Assessment - 10/22/16 0728    Subjective I took a nose-dive in my status for a couple of days with shooting pains in left lower leg.  Better than Monday.  Traction feels good while on it.  I don't usually have issues during the day, mostly evening.     Currently in Pain? Yes   Pain Score 2    Pain Location Back   Pain Orientation Left   Pain Type Acute pain                         [facility] Adult PT Treatment/Exercise - 10/22/16 0001      Lumbar Exercises: Standing   Row Strengthening;Power tower;Both;15 reps   Row Limitations 25#   Shoulder Extension Strengthening;Power Tower;Both;10 reps   Shoulder Extension Limitations 25#     Lumbar Exercises: Prone   Other Prone Lumbar Exercises press ups 10x2     Traction   Type of Traction Lumbar   Min (lbs) 50   Max (lbs) 100   Hold Time 50   Rest Time 10   Time 15 min       Discussion of home plan (low level) for weekend.  No push-ups, planks.           PT Short Term Goals - 10/22/16 0948      PT SHORT TERM GOAL #1   Title independent with initial HEP   Status Achieved     PT SHORT TERM GOAL #2   Title reports 25% reduction of pain when getting out of bed in the morning   Time 4   Period Weeks   Status On-going      PT SHORT TERM GOAL #3   Title able to stand for 1 hour without increased pain   Time 4   Period Weeks   Status On-going     PT SHORT TERM GOAL #4   Title reports no radiating pain and able to centralize pain with extension exercises   Time 4   Period Weeks   Status On-going           PT Long Term Goals - 10/22/16 0949      PT LONG TERM GOAL #1   Title FOTO < or equal to 34% limited   Time 8   Period Weeks   Status On-going     PT LONG TERM GOAL #2   Title able to perform all job related tasks including standing for long periods of time with 50% less pain   Period Weeks   Status On-going     PT LONG TERM GOAL #3   Title able to return to a regular exercise routine and safely work  back into full exercise routine as part of advanced HEP   Time 8   Period Weeks   Status On-going     PT LONG TERM GOAL #4   Title able to safely lift his child with good body mechanics and no increased pain   Time 8   Period Weeks   Status On-going               Plan - 10/22/16 0744    Clinical Impression Statement The patient reports he is better following a 2 day flare up with increased peripheral symptoms.  Good relief with lumbar traction although attempted standing core stabilization ex following which increased left back symptoms.  Discussed home plan for weekend.  If symptoms centralized will resume additional core strengthening next week.  Slower to meet goals secondary to exacerbations.     PT Treatment/Interventions ADLs/Self Care Home Management;Cryotherapy;Electrical Stimulation;Iontophoresis 4mg /ml Dexamethasone;Moist Heat;Traction;Ultrasound;Therapeutic activities;Therapeutic exercise;Neuromuscular re-education;Patient/family education;Manual techniques;Dry needling;Taping   PT Next Visit Plan  continue lumbar traction; extension biased ex with overpressure;  core strengthening progression;  electrical stimulation/heat if needed      Patient will benefit from skilled  therapeutic intervention in order to improve the following deficits and impairments:  Decreased range of motion, Decreased strength, Increased muscle spasms, Pain, Impaired flexibility, Postural dysfunction  Visit Diagnosis: Acute left-sided low back pain, with sciatica presence unspecified  Muscle weakness (generalized)  Muscle spasm of back     Problem List There are no active problems to display for this patient.  Ruben Im, PT 10/22/16 9:51 AM Phone: [phone] Fax: [phone]  [name] 10/22/2016, 9:50 AM  [facility] [address] [place], [place], [address] Phone: [phone]   Fax:  [phone]  Name: Zachary Oliver MRN: [id number] Date of Birth: 10-05-1975

## 2016-10-27 ENCOUNTER — Ambulatory Visit: Payer: 59 | Admitting: Physical Therapy

## 2016-10-27 DIAGNOSIS — M545 Low back pain: Secondary | ICD-10-CM | POA: Diagnosis not present

## 2016-10-27 DIAGNOSIS — M6281 Muscle weakness (generalized): Secondary | ICD-10-CM

## 2016-10-27 DIAGNOSIS — M62830 Muscle spasm of back: Secondary | ICD-10-CM

## 2016-10-27 NOTE — Therapy (Signed)
[facility] [address], [place], [place], [address] Phone: [phone]   Fax:  [phone]  Physical Therapy Treatment  Patient Details  Name: Zachary Oliver MRN: [id number] Date of Birth: [birth year] Referring Provider: Lennette Bihari Via  Encounter Date: 10/27/2016      PT End of Session - 10/27/16 1640    Visit Number 9   Date for PT Re-Evaluation 12/15/16   PT Start Time 0730   PT Stop Time 0810   PT Time Calculation (min) 40 min   Activity Tolerance Patient tolerated treatment well      No past medical history on file.  No past surgical history on file.  There were no vitals filed for this visit.      Subjective Assessment - 10/27/16 0732    Subjective It was a rough weekend with my leg and back.   Sunday was better.  Monday was really good but standing on concrete at the pool deck wasn't good last night.  Lower leg discomfort with standing.  Mostly sleeping better.   Currently in Pain? Yes   Pain Score 2    Pain Location Back   Pain Orientation Left   Pain Type Acute pain   Aggravating Factors  standing   Pain Relieving Factors sitting, forward stretches                         [facility] Adult PT Treatment/Exercise - 10/27/16 0001      Lumbar Exercises: Aerobic   Stationary Bike Nu-Step 7 min L1  discussed progress   UBE (Upper Arm Bike) 4 min forward/backward  while discussion status     Traction   Type of Traction Lumbar   Min (lbs) 50   Max (lbs) 100   Hold Time 60   Rest Time 10   Time 15 min                  PT Short Term Goals - 10/27/16 2703      PT SHORT TERM GOAL #1   Title independent with initial HEP   Status Achieved     PT SHORT TERM GOAL #2   Status Achieved     PT SHORT TERM GOAL #3   Title able to stand for 1 hour without increased pain   Time 4   Period Weeks   Status On-going     PT SHORT TERM GOAL #4   Title reports no radiating pain and  able to centralize pain with extension exercises   Time 4   Period Weeks   Status On-going           PT Long Term Goals - 10/27/16 0739      PT LONG TERM GOAL #1   Title FOTO < or equal to 34% limited   Time 8   Period Weeks   Status On-going     PT LONG TERM GOAL #2   Title able to perform all job related tasks including standing for long periods of time with 50% less pain   Time 8   Period Weeks   Status On-going     PT LONG TERM GOAL #3   Title able to return to a regular exercise routine and safely work back into full exercise routine as part of advanced HEP   Time 8   Period Weeks   Status On-going     PT LONG TERM GOAL #4   Title  able to safely lift his child with good body mechanics and no increased pain   Baseline (P)  Able to lift daughter if careful   Time 8   Period Weeks   Status On-going               Plan - 10/27/16 1640    Clinical Impression Statement The patient continues to have temporary improvements with mechanical traction but symptoms easily exacerbated and peripheralized with activity.   Reports increased pain over the weekend especially after prolonged standing.  He does report some improvements in sleeping and AM pain and is now able to lift his daughter.  Assess progress toward goals next visit to determine continuation if progressing or refer back to MD if progress is minimal.     Rehab Potential Excellent   PT Frequency 2x / week   PT Treatment/Interventions ADLs/Self Care Home Management;Cryotherapy;Electrical Stimulation;Iontophoresis 4mg /ml Dexamethasone;Moist Heat;Traction;Ultrasound;Therapeutic activities;Therapeutic exercise;Neuromuscular re-education;Patient/family education;Manual techniques;Dry needling;Taping   PT Next Visit Plan reassess progress toward goals including FOTO;  conditioning ex (UBE, nu-step, bike); continue lumbar traction; core strengthening progression;  electrical stimulation/heat if needed      Patient  will benefit from skilled therapeutic intervention in order to improve the following deficits and impairments:  Decreased range of motion, Decreased strength, Increased muscle spasms, Pain, Impaired flexibility, Postural dysfunction  Visit Diagnosis: Acute left-sided low back pain, with sciatica presence unspecified  Muscle weakness (generalized)  Muscle spasm of back     Problem List There are no active problems to display for this patient.  Ruben Im, PT 10/27/16 4:46 PM Phone: [phone] Fax: [phone]  [name] 10/27/2016, 4:45 PM  [facility] [address], [place], [place], [address] Phone: [phone]   Fax:  [phone]  Name: Vint Pola MRN: [id number] Date of Birth: September 23, 1975

## 2016-10-29 ENCOUNTER — Ambulatory Visit: Payer: 59 | Admitting: Physical Therapy

## 2016-10-29 DIAGNOSIS — M62830 Muscle spasm of back: Secondary | ICD-10-CM

## 2016-10-29 DIAGNOSIS — M545 Low back pain: Secondary | ICD-10-CM

## 2016-10-29 DIAGNOSIS — M6281 Muscle weakness (generalized): Secondary | ICD-10-CM

## 2016-10-29 NOTE — Therapy (Signed)
[facility] [address], [place], [place], [address] Phone: [phone]   Fax:  [phone]  Physical Therapy Treatment  Patient Details  Name: Zachary Oliver MRN: [id number] Date of Birth: 1975-10-14 Referring Provider: Lennette Bihari Via  Encounter Date: 10/29/2016      PT End of Session - 10/29/16 0733    Visit Number 10   Date for PT Re-Evaluation 12/15/16   PT Start Time 0733   PT Stop Time 0815   PT Time Calculation (min) 42 min   Activity Tolerance Patient tolerated treatment well      No past medical history on file.  No past surgical history on file.  There were no vitals filed for this visit.      Subjective Assessment - 10/29/16 0733    Subjective Work has been so busy/terrible I didn't even focus on my back.  I've been sitting a lot.  Evenings have been better.  Less standing.  LE symptoms still present intermittently.  Less intense pain.   Currently in Pain? Yes   Pain Score 1    Pain Location Back   Pain Orientation Left   Pain Type Acute pain   Aggravating Factors  standing   Pain Relieving Factors sitting            [facility] PT Assessment - 10/29/16 0001      Observation/Other Assessments   Focus on Therapeutic Outcomes (FOTO)  44%     AROM   Lumbar Flexion 40   Lumbar Extension 30     Special Tests   Lumbar Tests --  cross SLR                     [facility] Adult PT Treatment/Exercise - 10/29/16 0001      Lumbar Exercises: Stretches   Active Hamstring Stretch Limitations with strap 2x 20 sec bil   Single Knee to Chest Stretch 3 reps   Double Knee to Chest Stretch 3 reps     Lumbar Exercises: Aerobic   Stationary Bike Nu-Step 7 min L1  discussed progress     Lumbar Exercises: Prone   Other Prone Lumbar Exercises 10x     Traction   Type of Traction Lumbar   Min (lbs) 50   Max (lbs) 105   Hold Time 75   Rest Time 10   Time 15 min                  PT  Short Term Goals - 10/29/16 1715      PT SHORT TERM GOAL #1   Title independent with initial HEP   Status Achieved     PT SHORT TERM GOAL #2   Title reports 25% reduction of pain when getting out of bed in the morning   Status Achieved     PT SHORT TERM GOAL #3   Title able to stand for 1 hour without increased pain   Time 4   Period Weeks   Status On-going     PT SHORT TERM GOAL #4   Title reports no radiating pain and able to centralize pain with extension exercises   Time 4   Period Weeks   Status On-going           PT Long Term Goals - 10/29/16 0759      PT LONG TERM GOAL #1   Title FOTO < or equal to 34% limited   Time 8   Period  Weeks   Status On-going     PT LONG TERM GOAL #2   Title able to perform all job related tasks including standing for long periods of time with 50% less pain   Time 8   Period Weeks   Status On-going     PT LONG TERM GOAL #3   Title able to return to a regular exercise routine and safely work back into full exercise routine as part of advanced HEP   Time 8   Status On-going     PT LONG TERM GOAL #4   Title able to safely lift his child with good body mechanics and no increased pain   Time 8   Period Weeks   Status On-going               Plan - 10/29/16 1711    Clinical Impression Statement The patient reports he is overall between 50-70% better with decreased frequency and intensity of symptoms although continues to have distal LE symptoms.  He is able to tolerate 48 minutes of standing now before his back and leg pain are exacerbated.   The patient states he wants to avoid back surgery and wants to continue conservative treatment for this problem.  Progressing with rehab goals although unable to achieve full centralization.     Rehab Potential Excellent   PT Frequency 2x / week   PT Duration 8 weeks   PT Next Visit Plan  conditioning ex (UBE, nu-step, bike); continue lumbar traction; core strengthening progression;   electrical stimulation/heat if needed      Patient will benefit from skilled therapeutic intervention in order to improve the following deficits and impairments:  Decreased range of motion, Decreased strength, Increased muscle spasms, Pain, Impaired flexibility, Postural dysfunction  Visit Diagnosis: Acute left-sided low back pain, with sciatica presence unspecified  Muscle weakness (generalized)  Muscle spasm of back     Problem List There are no active problems to display for this patient.  Ruben Im, PT 10/29/16 5:17 PM Phone: [phone] Fax: [phone]  [name] 10/29/2016, 5:16 PM  [facility] [address], [place], [place], [address] Phone: [phone]   Fax:  [phone]  Name: Zachary Oliver MRN: [id number] Date of Birth: 19-Feb-1976

## 2016-11-09 ENCOUNTER — Ambulatory Visit: Payer: 59 | Admitting: Physical Therapy

## 2016-11-09 ENCOUNTER — Encounter: Payer: Self-pay | Admitting: Physical Therapy

## 2016-11-09 DIAGNOSIS — M62830 Muscle spasm of back: Secondary | ICD-10-CM

## 2016-11-09 DIAGNOSIS — M545 Low back pain: Secondary | ICD-10-CM

## 2016-11-09 DIAGNOSIS — M6281 Muscle weakness (generalized): Secondary | ICD-10-CM

## 2016-11-09 NOTE — Therapy (Signed)
[facility] [address], [place], [place], [address] Phone: [phone]   Fax:  [phone]  Physical Therapy Treatment  Patient Details  Name: Zachary Oliver MRN: [id number] Date of Birth: 02/12/1976 Referring Provider: Lennette Bihari Via  Encounter Date: 11/09/2016      PT End of Session - 11/09/16 1132    Visit Number 11   Date for PT Re-Evaluation 12/15/16   Authorization Type [employer]   Authorization - Visit Number 11   Authorization - Number of Visits 60   PT Start Time 1100   PT Stop Time 1155   PT Time Calculation (min) 55 min   Activity Tolerance Patient tolerated treatment well   Behavior During Therapy [facility] for tasks assessed/performed      History reviewed. No pertinent past medical history.  History reviewed. No pertinent surgical history.  There were no vitals filed for this visit.      Subjective Assessment - 11/09/16 1129    Subjective I feel better.  I feel like I have plataeued. During stretches left sided knee pain. Catching in left hip and makes it difficult to flex left hip.   Limitations Standing   How long can you stand comfortably? 20-30 minutes   Diagnostic tests x-ray   Patient Stated Goals Get back to where I can exercise on my own and be able to stand for 4 hours   Currently in Pain? Yes   Pain Score 7   rest is 1/10   Pain Location Back   Pain Orientation Left   Pain Descriptors / Indicators Aching;Throbbing   Pain Type Acute pain   Pain Onset More than a month ago   Pain Frequency Intermittent   Aggravating Factors  standing   Pain Relieving Factors sitting   Effect of Pain on Daily Activities working and exercise   Multiple Pain Sites No                         [facility] Adult PT Treatment/Exercise - 11/09/16 0001      Lumbar Exercises: Stretches   Active Hamstring Stretch Limitations with strap 2x 20 sec bil   Single Knee to Chest Stretch 3 reps   Double Knee to  Chest Stretch 3 reps     Lumbar Exercises: Aerobic   Stationary Bike Nu-Step 7 min L2; seat #13; arm #12  discussed progress     Modalities   Modalities Traction     Traction   Type of Traction Lumbar   Min (lbs) 50   Max (lbs) 105   Hold Time 75   Rest Time 10   Time 15 min                  PT Short Term Goals - 11/09/16 1136      PT SHORT TERM GOAL #3   Title able to stand for 1 hour without increased pain   Baseline 40 minutes   Time 4   Period Weeks     PT SHORT TERM GOAL #4   Title reports no radiating pain and able to centralize pain with extension exercises   Baseline feels it with 40 minutes of standing   Time 4   Period Weeks   Status On-going           PT Long Term Goals - 10/29/16 0759      PT LONG TERM GOAL #1   Title FOTO < or equal to  34% limited   Time 8   Period Weeks   Status On-going     PT LONG TERM GOAL #2   Title able to perform all job related tasks including standing for long periods of time with 50% less pain   Time 8   Period Weeks   Status On-going     PT LONG TERM GOAL #3   Title able to return to a regular exercise routine and safely work back into full exercise routine as part of advanced HEP   Time 8   Status On-going     PT LONG TERM GOAL #4   Title able to safely lift his child with good body mechanics and no increased pain   Time 8   Period Weeks   Status On-going               Plan - 11/09/16 1110    Clinical Impression Statement Patient is able to stand for 40 minutes but afterwards he will have radiating pain. Patient has a catching feeling in left hip with flexion.  Patient feels traction helps him more than electrical stimualtion.  Patient has not met all of his STG's yet.  Patient will benefit from skilled therapy to centralize pain and return to prior function.    Rehab Potential Excellent   Clinical Impairments Affecting Rehab Potential n/a   PT Frequency 2x / week   PT Duration 8 weeks    PT Treatment/Interventions ADLs/Self Care Home Management;Cryotherapy;Electrical Stimulation;Iontophoresis 45m/ml Dexamethasone;Moist Heat;Traction;Ultrasound;Therapeutic activities;Therapeutic exercise;Neuromuscular re-education;Patient/family education;Manual techniques;Dry needling;Taping   PT Next Visit Plan  conditioning ex (UBE, nu-step, bike); continue lumbar traction; core strengthening progression;  electrical stimulation/heat if needed   PT Home Exercise Plan progress as needed   Recommended Other Services initial cert signed on 70/05/5246  Consulted and Agree with Plan of Care Patient      Patient will benefit from skilled therapeutic intervention in order to improve the following deficits and impairments:  Decreased range of motion, Decreased strength, Increased muscle spasms, Pain, Impaired flexibility, Postural dysfunction  Visit Diagnosis: Acute left-sided low back pain, with sciatica presence unspecified  Muscle weakness (generalized)  Muscle spasm of back     Problem List There are no active problems to display for this patient.   CEarlie Counts PT 11/09/16 11:42 AM   [facility] [address] [place] [address]Phone: [phone]  Fax:  [phone] Name: DJaystin McgarveyMRN: [id number] of Birth: [birth year]

## 2016-11-12 ENCOUNTER — Ambulatory Visit: Payer: 59

## 2016-11-12 DIAGNOSIS — M6281 Muscle weakness (generalized): Secondary | ICD-10-CM

## 2016-11-12 DIAGNOSIS — M545 Low back pain: Secondary | ICD-10-CM | POA: Diagnosis not present

## 2016-11-12 DIAGNOSIS — M62830 Muscle spasm of back: Secondary | ICD-10-CM

## 2016-11-12 NOTE — Therapy (Signed)
[facility] [address] [place], [place], [address] Phone: [phone]   Fax:  [phone]  Physical Therapy Treatment  Patient Details  Name: Zachary Oliver MRN: [id number] Date of Birth: 10-13-1975 Referring Provider: Lennette Bihari Via  Encounter Date: 11/12/2016      PT End of Session - 11/12/16 1117    Visit Number 12   Date for PT Re-Evaluation 12/15/16   Authorization Type UHC   Authorization - Visit Number 12   Authorization - Number of Visits 60   PT Start Time 1110   PT Stop Time 1200   PT Time Calculation (min) 50 min   Activity Tolerance Patient tolerated treatment well   Behavior During Therapy [facility] for tasks assessed/performed      No past medical history on file.  No past surgical history on file.  There were no vitals filed for this visit.      Subjective Assessment - 11/12/16 1113    Subjective Pt. noting shooting pain down L LE for short duration on Tuesday while reaching to floor.     Patient Stated Goals Get back to where I can exercise on my own and be able to stand for 4 hours   Currently in Pain? Yes   Pain Score 1    Pain Location Back   Pain Orientation Left   Pain Descriptors / Indicators Aching   Pain Type Acute pain   Pain Onset More than a month ago   Pain Frequency Intermittent   Aggravating Factors  Prolonged standing    Pain Relieving Factors Sitting   Multiple Pain Sites No                         [facility] Adult PT Treatment/Exercise - 11/12/16 1136      Lumbar Exercises: Stretches   Active Hamstring Stretch Limitations with strap 2 x 30 sec bil   Single Knee to Chest Stretch 3 reps;30 seconds   Piriformis Stretch 2 reps;30 seconds   Piriformis Stretch Limitations with strap; figure-4     Lumbar Exercises: Aerobic   Elliptical Recumbent bike: lvl 2, 8 min      Lumbar Exercises: Standing   Functional Squats 15 reps;3 seconds     Lumbar Exercises:  Supine   Bridge 20 reps;5 seconds   Other Supine Lumbar Exercises sciatic neural flossing 5x right/left  Increased "tingling" with R      Traction   Type of Traction Lumbar   Min (lbs) 50   Max (lbs) 105   Hold Time 75   Rest Time 10   Time 15 min                  PT Short Term Goals - 11/09/16 1136      PT SHORT TERM GOAL #3   Title able to stand for 1 hour without increased pain   Baseline 40 minutes   Time 4   Period Weeks     PT SHORT TERM GOAL #4   Title reports no radiating pain and able to centralize pain with extension exercises   Baseline feels it with 40 minutes of standing   Time 4   Period Weeks   Status On-going           PT Long Term Goals - 10/29/16 0759      PT LONG TERM GOAL #1   Title FOTO < or equal to 34% limited   Time  8   Period Weeks   Status On-going     PT LONG TERM GOAL #2   Title able to perform all job related tasks including standing for long periods of time with 50% less pain   Time 8   Period Weeks   Status On-going     PT LONG TERM GOAL #3   Title able to return to a regular exercise routine and safely work back into full exercise routine as part of advanced HEP   Time 8   Status On-going     PT LONG TERM GOAL #4   Title able to safely lift his child with good body mechanics and no increased pain   Time 8   Period Weeks   Status On-going               Plan - 11/12/16 1138    Clinical Impression Statement Pt. doing well today noting benefit from traction.  Lumbar traction continued at 105# max today in neutral, hooklying pull.  Pt. noting some "tingling" pain when reaching to floor on Tuesday thus neural flossing for sciatic nerve performed today.  Pt. with significant reproduction of radiating "tingling" symptoms with flossing technique today.  Some glute strengthening today which was well tolerance.  Pt. left therapy noting benefit from traction.     PT Treatment/Interventions ADLs/Self Care Home  Management;Cryotherapy;Electrical Stimulation;Iontophoresis 4mg /ml Dexamethasone;Moist Heat;Traction;Ultrasound;Therapeutic activities;Therapeutic exercise;Neuromuscular re-education;Patient/family education;Manual techniques;Dry needling;Taping   PT Next Visit Plan  conditioning ex (UBE, nu-step, bike); continue lumbar traction; core strengthening progression; electrical stimulation/heat if needed      Patient will benefit from skilled therapeutic intervention in order to improve the following deficits and impairments:  Decreased range of motion, Decreased strength, Increased muscle spasms, Pain, Impaired flexibility, Postural dysfunction  Visit Diagnosis: Acute left-sided low back pain, with sciatica presence unspecified  Muscle weakness (generalized)  Muscle spasm of back     Problem List There are no active problems to display for this patient.   Bess Harvest, PTA 11/12/16 5:46 PM  [facility] [address] [place], [place], [address] Phone: [phone]   Fax:  [phone]  Name: Zachary Oliver MRN: [id number] Date of Birth: 30-Oct-1975

## 2016-11-16 ENCOUNTER — Encounter: Payer: Self-pay | Admitting: Physical Therapy

## 2016-11-16 ENCOUNTER — Ambulatory Visit: Payer: 59 | Admitting: Physical Therapy

## 2016-11-16 DIAGNOSIS — M62830 Muscle spasm of back: Secondary | ICD-10-CM

## 2016-11-16 DIAGNOSIS — M6281 Muscle weakness (generalized): Secondary | ICD-10-CM

## 2016-11-16 DIAGNOSIS — M545 Low back pain: Secondary | ICD-10-CM | POA: Diagnosis not present

## 2016-11-16 NOTE — Patient Instructions (Signed)
   CLAM SHELLS  While lying on your side with your knees bent, draw up the top knee while keeping contact of your feet together.  Do not let your pelvis roll back during the lifting movement Do 20x each side     BRIDGING  While lying on your back, tighten your lower abdominals, squeeze your buttocks and then raise your buttocks off the floor/bed as creating a "Bridge" with your body. Hold and then lower yourself and repeat.  Do 20x holding 5 seconds

## 2016-11-16 NOTE — Therapy (Signed)
[facility] [address], [place], [place], [address] Phone: [phone]   Fax:  [phone]  Physical Therapy Treatment  Patient Details  Name: Zachary Oliver MRN: [id number] Date of Birth: 10-15-1975 Referring Provider: Lennette Bihari Via  Encounter Date: 11/16/2016      PT End of Session - 11/16/16 0807    Visit Number 13   Date for PT Re-Evaluation 12/15/16   Authorization Type [employer]   Authorization - Visit Number 13   Authorization - Number of Visits 60   PT Start Time 0800   PT Stop Time 9326   PT Time Calculation (min) 55 min   Activity Tolerance Patient tolerated treatment well   Behavior During Therapy [facility] for tasks assessed/performed      History reviewed. No pertinent past medical history.  History reviewed. No pertinent surgical history.  There were no vitals filed for this visit.      Subjective Assessment - 11/16/16 0804    Subjective Reports not sharp shooting pains, but feels "hunched over" and dull pain in central low back.  When I get the shooting pain I stop doing what causes it but the left side continues to be sore.   Limitations Standing   How long can you stand comfortably? 20-30 minutes   Patient Stated Goals Get back to where I can exercise on my own and be able to stand for 4 hours   Currently in Pain? No/denies                         [facility] Adult PT Treatment/Exercise - 11/16/16 0001      Lumbar Exercises: Stretches   Active Hamstring Stretch Limitations with strap 2 x 30 sec bil     Lumbar Exercises: Aerobic   Elliptical Recumbent bike: lvl 1, 4 min , lvl 5 4 min   UBE (Upper Arm Bike) 3 min fwd/ 3 min back sitting on blue ball with core stab     Lumbar Exercises: Supine   Bridge 20 reps;5 seconds     Lumbar Exercises: Sidelying   Clam 10 reps   Hip Abduction --     Lumbar Exercises: Prone   Straight Leg Raises Limitations hip extension - 10x   Other Prone  Lumbar Exercises 10x     Traction   Type of Traction Lumbar   Min (lbs) 50   Max (lbs) 105   Hold Time 60   Rest Time 10   Time 15                PT Education - 11/16/16 0840    Education provided Yes   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) Educated Patient   Methods Explanation;Demonstration;Handout;Tactile cues;Verbal cues   Comprehension Verbalized understanding;Returned demonstration          PT Short Term Goals - 11/16/16 0805      PT SHORT TERM GOAL #3   Title able to stand for 1 hour without increased pain   Baseline 45 min   Time 4   Period Weeks   Status On-going           PT Long Term Goals - 10/29/16 0759      PT LONG TERM GOAL #1   Title FOTO < or equal to 34% limited   Time 8   Period Weeks   Status On-going     PT LONG TERM GOAL #2   Title able  to perform all job related tasks including standing for long periods of time with 50% less pain   Time 8   Period Weeks   Status On-going     PT LONG TERM GOAL #3   Title able to return to a regular exercise routine and safely work back into full exercise routine as part of advanced HEP   Time 8   Status On-going     PT LONG TERM GOAL #4   Title able to safely lift his child with good body mechanics and no increased pain   Time 8   Period Weeks   Status On-going               Plan - 11/16/16 0809    Clinical Impression Statement Pt did well with exercises.  He continues to have radiating symptoms with neural flossing and H/S stretches.  Pt did well with gentle core strengthengin and added to HEP.  Pt will benefit from skilled PT to work on core strength and endurance and continue traction for symptoms control.    PT Treatment/Interventions ADLs/Self Care Home Management;Cryotherapy;Electrical Stimulation;Iontophoresis 4mg /ml Dexamethasone;Moist Heat;Traction;Ultrasound;Therapeutic activities;Therapeutic exercise;Neuromuscular re-education;Patient/family education;Manual  techniques;Dry needling;Taping   PT Next Visit Plan  conditioning ex (UBE, nu-step, bike); continue lumbar traction; core strengthening progression; electrical stimulation/heat if needed   Consulted and Agree with Plan of Care Patient      Patient will benefit from skilled therapeutic intervention in order to improve the following deficits and impairments:  Decreased range of motion, Decreased strength, Increased muscle spasms, Pain, Impaired flexibility, Postural dysfunction  Visit Diagnosis: Acute left-sided low back pain, with sciatica presence unspecified  Muscle weakness (generalized)  Muscle spasm of back     Problem List There are no active problems to display for this patient.   Zannie Cove, PT 11/16/2016, 9:29 AM  [facility] [address] [place], [place], [address] Phone: [phone]   Fax:  [phone]  Name: Jsoeph Podesta MRN: [id number] Date of Birth: [birth year]

## 2016-11-18 DIAGNOSIS — B078 Other viral warts: Secondary | ICD-10-CM | POA: Diagnosis not present

## 2016-11-18 DIAGNOSIS — D225 Melanocytic nevi of trunk: Secondary | ICD-10-CM | POA: Diagnosis not present

## 2016-11-18 DIAGNOSIS — L738 Other specified follicular disorders: Secondary | ICD-10-CM | POA: Diagnosis not present

## 2016-11-18 DIAGNOSIS — L503 Dermatographic urticaria: Secondary | ICD-10-CM | POA: Diagnosis not present

## 2016-11-19 ENCOUNTER — Ambulatory Visit: Payer: 59 | Admitting: Physical Therapy

## 2016-11-19 ENCOUNTER — Encounter: Payer: Self-pay | Admitting: Physical Therapy

## 2016-11-19 DIAGNOSIS — M62830 Muscle spasm of back: Secondary | ICD-10-CM

## 2016-11-19 DIAGNOSIS — M545 Low back pain: Secondary | ICD-10-CM

## 2016-11-19 DIAGNOSIS — M6281 Muscle weakness (generalized): Secondary | ICD-10-CM

## 2016-11-19 NOTE — Therapy (Addendum)
[facility] [address] [place], [place], [address] Phone: [phone]   Fax:  [phone]  Physical Therapy Treatment  Patient Details  Name: Zachary Oliver MRN: [id number] Date of Birth: 09/12/1975 Referring Provider: Lennette Bihari Via  Encounter Date: 11/19/2016      PT End of Session - 11/19/16 0804    Visit Number 14   Date for PT Re-Evaluation 12/15/16   Authorization Type UHC   Authorization - Visit Number 14   Authorization - Number of Visits 60   PT Start Time 6761   PT Stop Time 9509   PT Time Calculation (min) 60 min   Activity Tolerance Patient tolerated treatment well   Behavior During Therapy [facility] for tasks assessed/performed      History reviewed. No pertinent past medical history.  History reviewed. No pertinent surgical history.  There were no vitals filed for this visit.      Subjective Assessment - 11/19/16 0800    Subjective Yesterday was pretty terrible.  The pain was all over the low back and into the left hip.  The stretching didn't help, ice helped a little.     Limitations Standing   How long can you stand comfortably? 20-30 minutes   Patient Stated Goals Get back to where I can exercise on my own and be able to stand for 4 hours   Currently in Pain? Yes   Pain Score 3    Pain Location Back   Pain Orientation Left   Pain Descriptors / Indicators Aching   Pain Type Acute pain   Pain Onset More than a month ago   Pain Frequency Intermittent   Aggravating Factors  not sure, just did all positions yesterday and nothing helped   Multiple Pain Sites No                         [facility] Adult PT Treatment/Exercise - 11/19/16 0001      Lumbar Exercises: Stretches   Active Hamstring Stretch Limitations with strap 2 x 30 sec bil   Single Knee to Chest Stretch --     Lumbar Exercises: Aerobic   Stationary Bike Nu-Step 10 min L2; seat #13; LE only  discussed progress     Lumbar Exercises: Supine   Clam 10 reps;5 seconds  red band single leg   Large Ball Abdominal Isometric 10 reps;5 seconds  slowly lifting ball overhead and back down   Other Supine Lumbar Exercises hip adduction 10x with TrA contraction     Cryotherapy   Number Minutes Cryotherapy --   Cryotherapy Location --   Type of Cryotherapy --     Acupuncturist Location --   Printmaker Action --   Printmaker Parameters --   Electrical Stimulation Goals --     Traction   Type of Traction Lumbar   Min (lbs) 50   Max (lbs) 105   Hold Time 70   Rest Time 10   Time 15                  PT Short Term Goals - 11/16/16 0805      PT SHORT TERM GOAL #3   Title able to stand for 1 hour without increased pain   Baseline 45 min   Time 4   Period Weeks   Status On-going           PT Long Term Goals -  10/29/16 0759      PT LONG TERM GOAL #1   Title FOTO < or equal to 34% limited   Time 8   Period Weeks   Status On-going     PT LONG TERM GOAL #2   Title able to perform all job related tasks including standing for long periods of time with 50% less pain   Time 8   Period Weeks   Status On-going     PT LONG TERM GOAL #3   Title able to return to a regular exercise routine and safely work back into full exercise routine as part of advanced HEP   Time 8   Status On-going     PT LONG TERM GOAL #4   Title able to safely lift his child with good body mechanics and no increased pain   Time 8   Period Weeks   Status On-going               Plan - 11/19/16 4580    Clinical Impression Statement Patient did well with exercises and had less symptoms in Rt LE with h/s stretch.  Pt was educated on not doing bridge and sidelying clam and instead to clam in supine due to increased back pain yesterday.  Pt will continue to benefit from skilled PT to progress core strength and spine decompression.     Rehab Potential  Excellent   PT Treatment/Interventions ADLs/Self Care Home Management;Cryotherapy;Electrical Stimulation;Iontophoresis 39m/ml Dexamethasone;Moist Heat;Traction;Ultrasound;Therapeutic activities;Therapeutic exercise;Neuromuscular re-education;Patient/family education;Manual techniques;Dry needling;Taping   PT Next Visit Plan  conditioning ex (UBE, nu-step, bike); continue lumbar traction; core strengthening progression; electrical stimulation/heat if needed   PT Home Exercise Plan progress as needed   Consulted and Agree with Plan of Care Patient      Patient will benefit from skilled therapeutic intervention in order to improve the following deficits and impairments:  Decreased range of motion, Decreased strength, Increased muscle spasms, Pain, Impaired flexibility, Postural dysfunction  Visit Diagnosis: Acute left-sided low back pain, with sciatica presence unspecified  Muscle weakness (generalized)  Muscle spasm of back     Problem List There are no active problems to display for this patient.   JZannie Cove PT 11/19/2016, 9:03 AM  [facility] [address] [place] [address]Phone: [phone]  Fax:  [phone] Name: Zachary BenekeMRN: [id number] of Birth: 305-24-1977PHYSICAL THERAPY DISCHARGE SUMMARY  Visits from Start of Care: 14  Current functional level related to goals / functional outcomes: See above   Remaining deficits: See above   Education / Equipment: HEP  Plan: Patient agrees to discharge.  Patient goals were not met. Patient is being discharged due to the patient's request.  ?????         Pt seeing a specialist due to lack of progress JZannie Cove PT 11/30/16 10:41 AM

## 2016-11-20 DIAGNOSIS — M5416 Radiculopathy, lumbar region: Secondary | ICD-10-CM | POA: Diagnosis not present

## 2016-11-24 ENCOUNTER — Ambulatory Visit: Payer: 59 | Attending: Family Medicine

## 2016-11-26 ENCOUNTER — Encounter: Payer: 59 | Admitting: Physical Therapy

## 2016-12-08 DIAGNOSIS — M5442 Lumbago with sciatica, left side: Secondary | ICD-10-CM | POA: Diagnosis not present

## 2016-12-08 DIAGNOSIS — M545 Low back pain: Secondary | ICD-10-CM | POA: Diagnosis not present

## 2016-12-12 DIAGNOSIS — M5442 Lumbago with sciatica, left side: Secondary | ICD-10-CM | POA: Diagnosis not present

## 2016-12-15 DIAGNOSIS — M5442 Lumbago with sciatica, left side: Secondary | ICD-10-CM | POA: Diagnosis not present

## 2017-06-29 DIAGNOSIS — H179 Unspecified corneal scar and opacity: Secondary | ICD-10-CM | POA: Diagnosis not present

## 2017-06-29 DIAGNOSIS — H16423 Pannus (corneal), bilateral: Secondary | ICD-10-CM | POA: Diagnosis not present

## 2017-06-29 DIAGNOSIS — H5213 Myopia, bilateral: Secondary | ICD-10-CM | POA: Diagnosis not present

## 2017-11-18 DIAGNOSIS — M5442 Lumbago with sciatica, left side: Secondary | ICD-10-CM | POA: Diagnosis not present

## 2017-12-02 DIAGNOSIS — M5442 Lumbago with sciatica, left side: Secondary | ICD-10-CM | POA: Diagnosis not present

## 2017-12-08 DIAGNOSIS — M5442 Lumbago with sciatica, left side: Secondary | ICD-10-CM | POA: Diagnosis not present

## 2017-12-16 DIAGNOSIS — M5442 Lumbago with sciatica, left side: Secondary | ICD-10-CM | POA: Diagnosis not present

## 2017-12-24 DIAGNOSIS — M5442 Lumbago with sciatica, left side: Secondary | ICD-10-CM | POA: Diagnosis not present

## 2017-12-29 DIAGNOSIS — M5442 Lumbago with sciatica, left side: Secondary | ICD-10-CM | POA: Diagnosis not present

## 2018-01-06 DIAGNOSIS — M5442 Lumbago with sciatica, left side: Secondary | ICD-10-CM | POA: Diagnosis not present

## 2018-01-17 DIAGNOSIS — M5442 Lumbago with sciatica, left side: Secondary | ICD-10-CM | POA: Diagnosis not present

## 2018-02-01 DIAGNOSIS — M5442 Lumbago with sciatica, left side: Secondary | ICD-10-CM | POA: Diagnosis not present

## 2018-02-03 DIAGNOSIS — E7800 Pure hypercholesterolemia, unspecified: Secondary | ICD-10-CM | POA: Diagnosis not present

## 2018-02-03 DIAGNOSIS — Z0000 Encounter for general adult medical examination without abnormal findings: Secondary | ICD-10-CM | POA: Diagnosis not present

## 2018-02-15 DIAGNOSIS — M5442 Lumbago with sciatica, left side: Secondary | ICD-10-CM | POA: Diagnosis not present

## 2018-03-08 DIAGNOSIS — M5442 Lumbago with sciatica, left side: Secondary | ICD-10-CM | POA: Diagnosis not present
